# Patient Record
Sex: Male | Born: 1965 | Race: White | Hispanic: No | Marital: Married | Smoking: Current every day smoker
Health system: Southern US, Community
[De-identification: ages and names within clinical notes are randomized; demographics above are authoritative.]

## PROBLEM LIST (undated history)

## (undated) HISTORY — PX: HERNIA REPAIR: SHX51

## (undated) HISTORY — PX: CHOLECYSTECTOMY: SHX55

## (undated) HISTORY — PX: CARDIAC CATHETERIZATION: SHX172

---

## 2014-06-22 ENCOUNTER — Emergency Department (HOSPITAL_COMMUNITY): Payer: BC Managed Care – PPO

## 2014-06-22 ENCOUNTER — Emergency Department (HOSPITAL_COMMUNITY)
Admission: EM | Admit: 2014-06-22 | Discharge: 2014-06-22 | Disposition: A | Discharge status: Home / Self Care | Payer: BC Managed Care – PPO | Attending: Emergency Medicine | Admitting: Emergency Medicine

## 2014-06-22 ENCOUNTER — Encounter (HOSPITAL_COMMUNITY): Payer: Self-pay | Admitting: Emergency Medicine

## 2014-06-22 DIAGNOSIS — K219 Gastro-esophageal reflux disease without esophagitis: Secondary | ICD-10-CM | POA: Insufficient documentation

## 2014-06-22 DIAGNOSIS — Z9889 Other specified postprocedural states: Secondary | ICD-10-CM | POA: Diagnosis not present

## 2014-06-22 DIAGNOSIS — R079 Chest pain, unspecified: Secondary | ICD-10-CM | POA: Insufficient documentation

## 2014-06-22 DIAGNOSIS — Z720 Tobacco use: Secondary | ICD-10-CM | POA: Insufficient documentation

## 2014-06-22 DIAGNOSIS — Z9089 Acquired absence of other organs: Secondary | ICD-10-CM | POA: Insufficient documentation

## 2014-06-22 LAB — CBC
HCT: 41.4 % (ref 39.0–52.0)
Hemoglobin: 14.7 g/dL (ref 13.0–17.0)
MCH: 29.8 pg (ref 26.0–34.0)
MCHC: 35.5 g/dL (ref 30.0–36.0)
MCV: 84 fL (ref 78.0–100.0)
Platelets: 173 10*3/uL (ref 150–400)
RBC: 4.93 MIL/uL (ref 4.22–5.81)
RDW: 12.9 % (ref 11.5–15.5)
WBC: 6.3 10*3/uL (ref 4.0–10.5)

## 2014-06-22 LAB — BASIC METABOLIC PANEL
Anion gap: 15 (ref 5–15)
BUN: 19 mg/dL (ref 6–23)
CO2: 21 meq/L (ref 19–32)
CREATININE: 0.93 mg/dL (ref 0.50–1.35)
Calcium: 10.4 mg/dL (ref 8.4–10.5)
Chloride: 104 mEq/L (ref 96–112)
GFR calc Af Amer: >90 mL/min (ref >90)
GLUCOSE: 124 mg/dL — AB (ref 70–99)
Potassium: 4.6 mEq/L (ref 3.7–5.3)
Sodium: 140 mEq/L (ref 137–147)

## 2014-06-22 LAB — I-STAT TROPONIN, ED
Troponin i, poc: 0.01 ng/mL (ref 0.00–0.08)
Troponin i, poc: 0.02 ng/mL (ref 0.00–0.08)

## 2014-06-22 MED ORDER — GI COCKTAIL ~~LOC~~
30.0000 mL | Freq: Once | ORAL | Status: AC
  Administered 2014-06-22: 30 mL via ORAL
  Filled 2014-06-22: qty 30

## 2014-06-22 MED ORDER — OMEPRAZOLE 20 MG PO CPDR: 20.0000 mg | DELAYED_RELEASE_CAPSULE | Freq: Every day | ORAL | Status: AC

## 2014-06-22 NOTE — ED Provider Notes (Addendum)
CSN: [id number]     Arrival date & time 06/22/14  0310 History   First MD Initiated Contact with Patient 06/22/14 [phone]     Chief Complaint  Patient presents with  . Chest Pain      HPI Patient presents to the emergency department with complaints of some burning anterior chest discomfort without radiation and several episodes of vomiting.  This began approximately one to 2 hours ago.  He states he has an allergy to [place] and reports this is what he thought was the cause.  He began feeling worse and was concerned about his heart and therefore came to the ER for evaluation.  No prior history of cardiac disease.  He did have what sounds like an a [place]  He had a heart catheterization done that time that he described as "squeaky clean per the cardiologist".  He does not have exertional chest pain or shortness breath.  Denies shortness of breath this time.  He states his anterior chest burning and tightness is much better. [place] for [employer].    History reviewed. No pertinent past medical history. Past Surgical History  Procedure Laterality Date  . Cardiac catheterization    . Cholecystectomy    . Hernia repair     History reviewed. No pertinent family history. History  Substance Use Topics  . Smoking status: Current Every Day Smoker -- 0.50 packs/day for 25 years    Types: Cigarettes  . Smokeless tobacco: Never Used  . Alcohol Use: 0.6 oz/week    1 Cans of beer per week    Review of Systems  All other systems reviewed and are negative.     Allergies  Other and Thorazine  Home Medications   Prior to Admission medications   Not on File   BP 123/84  Pulse 99  Temp(Src) 98 F (36.7 C) (Oral)  Resp 16  Ht 5\' 9"  (1.753 m)  Wt 187 lb (84.823 kg)  BMI 27.60 kg/m2  SpO2 96% Physical Exam  Nursing note and vitals reviewed. Constitutional: He is oriented to person, place, and time. He appears  well-developed and well-nourished.  HENT:  Head: Normocephalic and atraumatic.  Eyes: EOM are normal.  Neck: Normal range of motion.  Cardiovascular: Normal rate, regular rhythm, normal heart sounds and intact distal pulses.   Pulmonary/Chest: Effort normal and breath sounds normal. No respiratory distress.  Abdominal: Soft. He exhibits no distension. There is no tenderness.  Musculoskeletal: Normal range of motion.  Neurological: He is alert and oriented to person, place, and time.  Skin: Skin is warm and dry.  Psychiatric: He has a normal mood and affect. Judgment normal.    ED Course  Procedures (including critical care time) Labs Review Labs Reviewed  BASIC METABOLIC PANEL - Abnormal; Notable for the following:    Glucose, Bld 124 (*)    All other components within normal limits  CBC  I-STAT TROPOININ, ED  Rosezena SensorI-STAT TROPOININ, ED    Imaging Review Dg Chest Port 1 View  06/22/2014   CLINICAL DATA:  Chest pain.  Initial encounter  EXAM: PORTABLE CHEST - 1 VIEW  COMPARISON:  None.  FINDINGS: Normal heart size and mediastinal contours. No acute infiltrate or edema. No effusion or pneumothorax. No acute osseous findings.  IMPRESSION: No active disease.   Electronically Signed   By: Tiburcio PeaJonathan  Watts M.D.   On: 06/22/2014 04:16     EKG Interpretation   Date/Time:  Sunday June 22 2014 03:19:42 EDT Ventricular Rate:  91 PR Interval:  146 QRS Duration: 90 QT Interval:  347 QTC Calculation: 427 R Axis:   82 Text Interpretation:  Sinus rhythm No old tracing to compare Confirmed by  Brando Taves  MD, Caryn BeeKEVIN ([id number]) on 06/22/2014 6:03:58 AM      MDM   Final diagnoses:  None    6:02 AM Patient feels much better in the emergency department.  Resolution of his symptoms with a GI cocktail.  Some of this may represented GERD/esophageal spasm.  Home on a PPI.  PCP followup.  It clean coronary arteries in 2007.  My suspicion for cardiac disease is low.  Doubt dissection.  No abdominal  discomfort or pain.  Overall well-appearing.  Discharge home in good condition.  Please note that the initial troponin was drawn at 3:20 AM with the rest of his initial labs.  It is documented as 422 but this is not the case as it was drawn at 320.  Lyanne CoKevin M Shamar Engelmann, MD 06/22/14 [id number]  Lyanne CoKevin M Burnie Therien, MD 06/22/14 [phone]

## 2014-06-22 NOTE — Discharge Instructions (Signed)
Chest Pain (Nonspecific) °It is often hard to give a specific diagnosis for the cause of chest pain. There is always a chance that your pain could be related to something serious, such as a heart attack or a blood clot in the lungs. You need to follow up with your health care provider for further evaluation. °CAUSES  °· Heartburn. °· Pneumonia or bronchitis. °· Anxiety or stress. °· Inflammation around your heart (pericarditis) or lung (pleuritis or pleurisy). °· A blood clot in the lung. °· A collapsed lung (pneumothorax). It can develop suddenly on its own (spontaneous pneumothorax) or from trauma to the chest. °· Shingles infection (herpes zoster virus). °The chest wall is composed of bones, muscles, and cartilage. Any of these can be the source of the pain. °· The bones can be bruised by injury. °· The muscles or cartilage can be strained by coughing or overwork. °· The cartilage can be affected by inflammation and become sore (costochondritis). °DIAGNOSIS  °Lab tests or other studies may be needed to find the cause of your pain. Your health care provider may have you take a test called an ambulatory electrocardiogram (ECG). An ECG records your heartbeat patterns over a 24-hour period. You may also have other tests, such as: °· Transthoracic echocardiogram (TTE). During echocardiography, sound waves are used to evaluate how blood flows through your heart. °· Transesophageal echocardiogram (TEE). °· Cardiac monitoring. This allows your health care provider to monitor your heart rate and rhythm in real time. °· Holter monitor. This is a portable device that records your heartbeat and can help diagnose heart arrhythmias. It allows your health care provider to track your heart activity for several days, if needed. °· Stress tests by exercise or by giving medicine that makes the heart beat faster. °TREATMENT  °· Treatment depends on what may be causing your chest pain. Treatment may include: °· Acid blockers for  heartburn. °· Anti-inflammatory medicine. °· Pain medicine for inflammatory conditions. °· Antibiotics if an infection is present. °· You may be advised to change lifestyle habits. This includes stopping smoking and avoiding alcohol, caffeine, and chocolate. °· You may be advised to keep your head raised (elevated) when sleeping. This reduces the chance of acid going backward from your stomach into your esophagus. °Most of the time, nonspecific chest pain will improve within 2-3 days with rest and mild pain medicine.  °HOME CARE INSTRUCTIONS  °· If antibiotics were prescribed, take them as directed. Finish them even if you start to feel better. °· For the next few days, avoid physical activities that bring on chest pain. Continue physical activities as directed. °· Do not use any tobacco products, including cigarettes, chewing tobacco, or electronic cigarettes. °· Avoid drinking alcohol. °· Only take medicine as directed by your health care provider. °· Follow your health care provider's suggestions for further testing if your chest pain does not go away. °· Keep any follow-up appointments you made. If you do not go to an appointment, you could develop lasting (chronic) problems with pain. If there is any problem keeping an appointment, call to reschedule. °SEEK MEDICAL CARE IF:  °· Your chest pain does not go away, even after treatment. °· You have a rash with blisters on your chest. °· You have a fever. °SEEK IMMEDIATE MEDICAL CARE IF:  °· You have increased chest pain or pain that spreads to your arm, neck, jaw, back, or abdomen. °· You have shortness of breath. °· You have an increasing cough, or you cough   up blood. °· You have severe back or abdominal pain. °· You feel nauseous or vomit. °· You have severe weakness. °· You faint. °· You have chills. °This is an emergency. Do not wait to see if the pain will go away. Get medical help at once. Call your local emergency services (911 in [place] Do not drive  yourself to the hospital. °MAKE SURE YOU:  °· Understand these instructions. °· Will watch your condition. °· Will get help right away if you are not doing well or get worse. °Document Released: 06/01/2005 Document Revised: 08/27/2013 Document Reviewed: 03/27/2008 °ExitCare® Patient Information ©2015 [employer]. This information is not intended to replace advice given to you by your health care provider. Make sure you discuss any questions you have with your health care provider. °Gastroesophageal Reflux Disease, Adult °Gastroesophageal reflux disease (GERD) happens when acid from your stomach flows up into the esophagus. When acid comes in contact with the esophagus, the acid causes soreness (inflammation) in the esophagus. Over time, GERD may create small holes (ulcers) in the lining of the esophagus. °CAUSES  °· Increased body weight. This puts pressure on the stomach, making acid rise from the stomach into the esophagus. °· Smoking. This increases acid production in the stomach. °· Drinking alcohol. This causes decreased pressure in the lower esophageal sphincter (valve or ring of muscle between the esophagus and stomach), allowing acid from the stomach into the esophagus. °· Late evening meals and a full stomach. This increases pressure and acid production in the stomach. °· A malformed lower esophageal sphincter. °Sometimes, no cause is found. °SYMPTOMS  °· Burning pain in the lower part of the mid-chest behind the breastbone and in the mid-stomach area. This may occur twice a week or more often. °· Trouble swallowing. °· Sore throat. °· Dry cough. °· Asthma-like symptoms including chest tightness, shortness of breath, or wheezing. °DIAGNOSIS  °Your caregiver may be able to diagnose GERD based on your symptoms. In some cases, X-rays and other tests may be done to check for complications or to check the condition of your stomach and esophagus. °TREATMENT  °Your caregiver may recommend over-the-counter or  prescription medicines to help decrease acid production. Ask your caregiver before starting or adding any new medicines.  °HOME CARE INSTRUCTIONS  °· Change the factors that you can control. Ask your caregiver for guidance concerning weight loss, quitting smoking, and alcohol consumption. °· Avoid foods and drinks that make your symptoms worse, such as: °¨ Caffeine or alcoholic drinks. °¨ Chocolate. °¨ Peppermint or mint flavorings. °¨ Garlic and onions. °¨ Spicy foods. °¨ Citrus fruits, such as oranges, lemons, or limes. °¨ Tomato-based foods such as sauce, chili, salsa, and pizza. °¨ Fried and fatty foods. °· Avoid lying down for the 3 hours prior to your bedtime or prior to taking a nap. °· Eat small, frequent meals instead of large meals. °· Wear loose-fitting clothing. Do not wear anything tight around your waist that causes pressure on your stomach. °· Raise the head of your bed 6 to 8 inches with wood blocks to help you sleep. Extra pillows will not help. °· Only take over-the-counter or prescription medicines for pain, discomfort, or fever as directed by your caregiver. °· Do not take aspirin, ibuprofen, or other nonsteroidal anti-inflammatory drugs (NSAIDs). °SEEK IMMEDIATE MEDICAL CARE IF:  °· You have pain in your arms, neck, jaw, teeth, or back. °· Your pain increases or changes in intensity or duration. °· You develop nausea, vomiting, or sweating (diaphoresis). °·   You develop shortness of breath, or you faint. °· Your vomit is green, yellow, black, or looks like coffee grounds or blood. °· Your stool is red, bloody, or black. °These symptoms could be signs of other problems, such as heart disease, gastric bleeding, or esophageal bleeding. °MAKE SURE YOU:  °· Understand these instructions. °· Will watch your condition. °· Will get help right away if you are not doing well or get worse. °Document Released: 06/01/2005 Document Revised: 11/14/2011 Document Reviewed: 03/11/2011 °ExitCare® Patient  Information ©2015 [employer]. This information is not intended to replace advice given to you by your health care provider. Make sure you discuss any questions you have with your health care provider. ° °

## 2014-06-22 NOTE — ED Notes (Signed)
Pt. Has an allergy to [place]. Thinks there may have been some in his meal. Pt. Says his allergic reaction is similar to his current symptoms except for the chest pain. Pt. Has had 1 episode of vomiting. Denies dizziness and SOB.

## 2014-06-22 NOTE — ED Notes (Signed)
Pt. Refused wheelchair 

## 2019-06-21 ENCOUNTER — Other Ambulatory Visit: Payer: Self-pay

## 2019-06-21 ENCOUNTER — Emergency Department (HOSPITAL_COMMUNITY): Payer: BC Managed Care – PPO

## 2019-06-21 ENCOUNTER — Emergency Department (HOSPITAL_COMMUNITY)
Admission: EM | Admit: 2019-06-21 | Discharge: 2019-06-21 | Disposition: A | Discharge status: Home / Self Care | Payer: BC Managed Care – PPO | Attending: Emergency Medicine | Admitting: Emergency Medicine

## 2019-06-21 ENCOUNTER — Encounter (HOSPITAL_COMMUNITY): Payer: Self-pay | Admitting: Emergency Medicine

## 2019-06-21 DIAGNOSIS — R0789 Other chest pain: Secondary | ICD-10-CM | POA: Diagnosis not present

## 2019-06-21 DIAGNOSIS — Z79899 Other long term (current) drug therapy: Secondary | ICD-10-CM | POA: Insufficient documentation

## 2019-06-21 DIAGNOSIS — F17210 Nicotine dependence, cigarettes, uncomplicated: Secondary | ICD-10-CM | POA: Insufficient documentation

## 2019-06-21 DIAGNOSIS — R079 Chest pain, unspecified: Secondary | ICD-10-CM | POA: Diagnosis present

## 2019-06-21 LAB — BASIC METABOLIC PANEL
Anion gap: 13 (ref 5–15)
BUN: 16 mg/dL (ref 6–20)
CO2: 22 mmol/L (ref 22–32)
Calcium: 10.4 mg/dL — ABNORMAL HIGH (ref 8.9–10.3)
Chloride: 104 mmol/L (ref 98–111)
Creatinine, Ser: 0.93 mg/dL (ref 0.61–1.24)
GFR calc Af Amer: >60 mL/min (ref >60)
GFR calc non Af Amer: >60 mL/min (ref >60)
Glucose, Bld: 96 mg/dL (ref 70–99)
Potassium: 3.8 mmol/L (ref 3.5–5.1)
Sodium: 139 mmol/L (ref 135–145)

## 2019-06-21 LAB — CBC
HCT: 45.4 % (ref 39.0–52.0)
Hemoglobin: 15.3 g/dL (ref 13.0–17.0)
MCH: 29.4 pg (ref 26.0–34.0)
MCHC: 33.7 g/dL (ref 30.0–36.0)
MCV: 87.1 fL (ref 80.0–100.0)
Platelets: 214 10*3/uL (ref 150–400)
RBC: 5.21 MIL/uL (ref 4.22–5.81)
RDW: 13.7 % (ref 11.5–15.5)
WBC: 7.7 10*3/uL (ref 4.0–10.5)
nRBC: 0 % (ref 0.0–0.2)

## 2019-06-21 LAB — TROPONIN I (HIGH SENSITIVITY)
Troponin I (High Sensitivity): 4 ng/L (ref <18)
Troponin I (High Sensitivity): 4 ng/L (ref <18)

## 2019-06-21 MED ORDER — SODIUM CHLORIDE 0.9% FLUSH: 3.0000 mL | Freq: Once | INTRAVENOUS | Status: DC

## 2019-06-21 NOTE — Discharge Instructions (Signed)
Please follow up with your cardiology Return to the nearest Emergency Department if you start to develop severe, constant chest pain, vomiting, dizziness, or shortness of breath

## 2019-06-21 NOTE — ED Provider Notes (Signed)
[facility] EMERGENCY DEPARTMENT Provider Note   CSN: [id number] Arrival date & time: 06/21/19  1103     History   Chief Complaint Chief Complaint  Patient presents with  . Chest Pain    HPI Ronald Kelly is a 53 y.o. male who presents with chest pain. PMH significant for moderate aortic stenosis, chronic back pain, GERD.  Patient states that he was in a meeting this morning at the [employer] when he started to have chest pain at approximately 10 AM while seated.  He states that it felt like a "poking" in his chest over the left side.  The pain was intermittent and did not radiate.  He had some mild diaphoresis and shortness of breath with it and then had to walk around and drink some water.  The pain went away but then returned and so he told his son that he needed to come to the emergency department.  He states that he was here approximately 5 years ago for chest pain as well.  At that time symptoms were attributed to acid reflux because he had a [place] which she is allergic to.  He does have a cardiologist in [place].  He has had a catheterization and echocardiogram.  He states that sometimes he has issues with his heart valve and her heart will race but he has had not had those symptoms today. He was admitted to [facility] general health for syncope in 2017. Was found to have episode of A.fib which resolved spontaneously. Was ruled out for MI. Echo in 2018 showed EF 60-65% with moderate aortic stenosis. Aortic valve is bicuspid. Has previously had a cath which did not show any obstructive disease. He reports drinking 2 beers 2 days ago. No drug use. No fever, cough, syncope, palpitations, abdominal pain, vomiting, leg swelling. He is currently asymptomatic.  HPI  History reviewed. No pertinent past medical history.  There are no active problems to display for this patient.   Past Surgical History:  Procedure Laterality Date  . CARDIAC  CATHETERIZATION    . CHOLECYSTECTOMY    . HERNIA REPAIR          Home Medications    Prior to Admission medications   Medication Sig Start Date End Date Taking? Authorizing Provider  gemfibrozil (LOPID) 600 MG tablet Take 600 mg by mouth 2 (two) times daily before a meal.    [provider]  metoprolol succinate (TOPROL-XL) 25 MG 24 hr tablet Take 25 mg by mouth daily.    [provider]  omeprazole (PRILOSEC) 20 MG capsule Take 1 capsule (20 mg total) by mouth daily. 06/22/14   Jola Schmidt, MD    Family History No family history on file.  Social History Social History   Tobacco Use  . Smoking status: Current Every Day Smoker    Packs/day: 0.50    Years: 25.00    Pack years: 12.50    Types: Cigarettes  . Smokeless tobacco: Never Used  Substance Use Topics  . Alcohol use: Yes    Alcohol/week: 1.0 standard drinks    Types: 1 Cans of beer per week  . Drug use: No     Allergies   Other and Thorazine [chlorpromazine]   Review of Systems Review of Systems  Constitutional: Negative for fever.  Respiratory: Negative for cough and shortness of breath.   Cardiovascular: Positive for chest pain. Negative for palpitations and leg swelling.  Gastrointestinal: Negative for abdominal pain, nausea and  vomiting.  Neurological: Negative for light-headedness.  All other systems reviewed and are negative.    Physical Exam Updated Vital Signs BP 131/88 (BP Location: Left Arm)   Pulse 97   Temp 98.4 F (36.9 C) (Oral)   Resp 18   SpO2 96%   Physical Exam Vitals signs and nursing note reviewed.  Constitutional:      General: He is not in acute distress.    Appearance: He is well-developed. He is not ill-appearing.  HENT:     Head: Normocephalic and atraumatic.  Eyes:     General: No scleral icterus.       Right eye: No discharge.        Left eye: No discharge.     Conjunctiva/sclera: Conjunctivae normal.     Pupils: Pupils are equal, round, and  reactive to light.  Neck:     Musculoskeletal: Normal range of motion.  Cardiovascular:     Rate and Rhythm: Normal rate and regular rhythm.  Pulmonary:     Effort: Pulmonary effort is normal. No respiratory distress.     Breath sounds: Normal breath sounds.  Abdominal:     General: There is no distension.     Palpations: Abdomen is soft.     Tenderness: There is no abdominal tenderness.  Skin:    General: Skin is warm and dry.  Neurological:     Mental Status: He is alert and oriented to person, place, and time.  Psychiatric:        Behavior: Behavior normal.      ED Treatments / Results  Labs (all labs ordered are listed, but only abnormal results are displayed) Labs Reviewed  BASIC METABOLIC PANEL - Abnormal; Notable for the following components:      Result Value   Calcium 10.4 (*)    All other components within normal limits  CBC  TROPONIN I (HIGH SENSITIVITY)  TROPONIN I (HIGH SENSITIVITY)    EKG EKG Interpretation  Date/Time:  Friday June 21 2019 14:02:34 EDT Ventricular Rate:  70 PR Interval:    QRS Duration: 93 QT Interval:  382 QTC Calculation: 413 R Axis:   77 Text Interpretation:  Normal sinus rhythm No significant change since last tracing Confirmed by Margarita Grizzle [phone]) on 06/21/2019 2:07:42 PM Also confirmed by Margarita Grizzle [phone]), editor Elita Quick ([id number])  on 06/21/2019 2:15:16 PM   Radiology Dg Chest 2 View  Result Date: 06/21/2019 CLINICAL DATA:  Chest pain EXAM: CHEST - 2 VIEW COMPARISON:  Chest radiograph dated 06/22/2014. FINDINGS: The heart size and mediastinal contours are within normal limits. Both lungs are clear. The visualized skeletal structures are unremarkable. IMPRESSION: No active cardiopulmonary disease. Electronically Signed   By: Romona Curls M.D.   On: 06/21/2019 11:58    Procedures Procedures (including critical care time)  Medications Ordered in ED Medications  sodium chloride flush (NS) 0.9 %  injection 3 mL (has no administration in time range)     Initial Impression / Assessment and Plan / ED Course  I have reviewed the triage vital signs and the nursing notes.  Pertinent labs & imaging results that were available during my care of the patient were reviewed by me and considered in my medical decision making (see chart for details).  Chest pain work up is reassuring. Doubt ACS, PE, pericarditis, esophageal rupture, tension pneumothorax, aortic dissection, cardiac tamponade. EKG is NSR and shows no significant change since last. CXR is negative. Initial and second troponin is 4 and 4  respectively. Labs are unremarkable. Patient is non-smoker. HEART score is 2. Previous cardiac work up by Dr. Laurey MoraleGohn with [facility] [place]. Advised f/u with Cardiology.   Final Clinical Impressions(s) / ED Diagnoses   Final diagnoses:  Atypical chest pain    ED Discharge Orders    None       Bethel BornGekas, Mikalyn Hermida Marie, PA-C 06/21/19 1508    Margarita Grizzleay, Danielle, MD 06/24/19 [phone]

## 2019-06-21 NOTE — ED Notes (Signed)
Patient verbalizes understanding of discharge instructions. Opportunity for questioning and answers were provided. Pt discharged from ED. 

## 2019-06-21 NOTE — ED Triage Notes (Signed)
Pt states had some cp and sob this am is here from [place] with [employer] , has a troubled bicuspid valve he states  And reflux

## 2021-06-22 IMAGING — DX DG CHEST 2V
2 series · 2 of 2 positions shown · non-contrast
Comparison: Chest radiograph dated 06/22/2014.

CLINICAL DATA: Chest pain

EXAM:
CHEST - 2 VIEW

[chest pa]
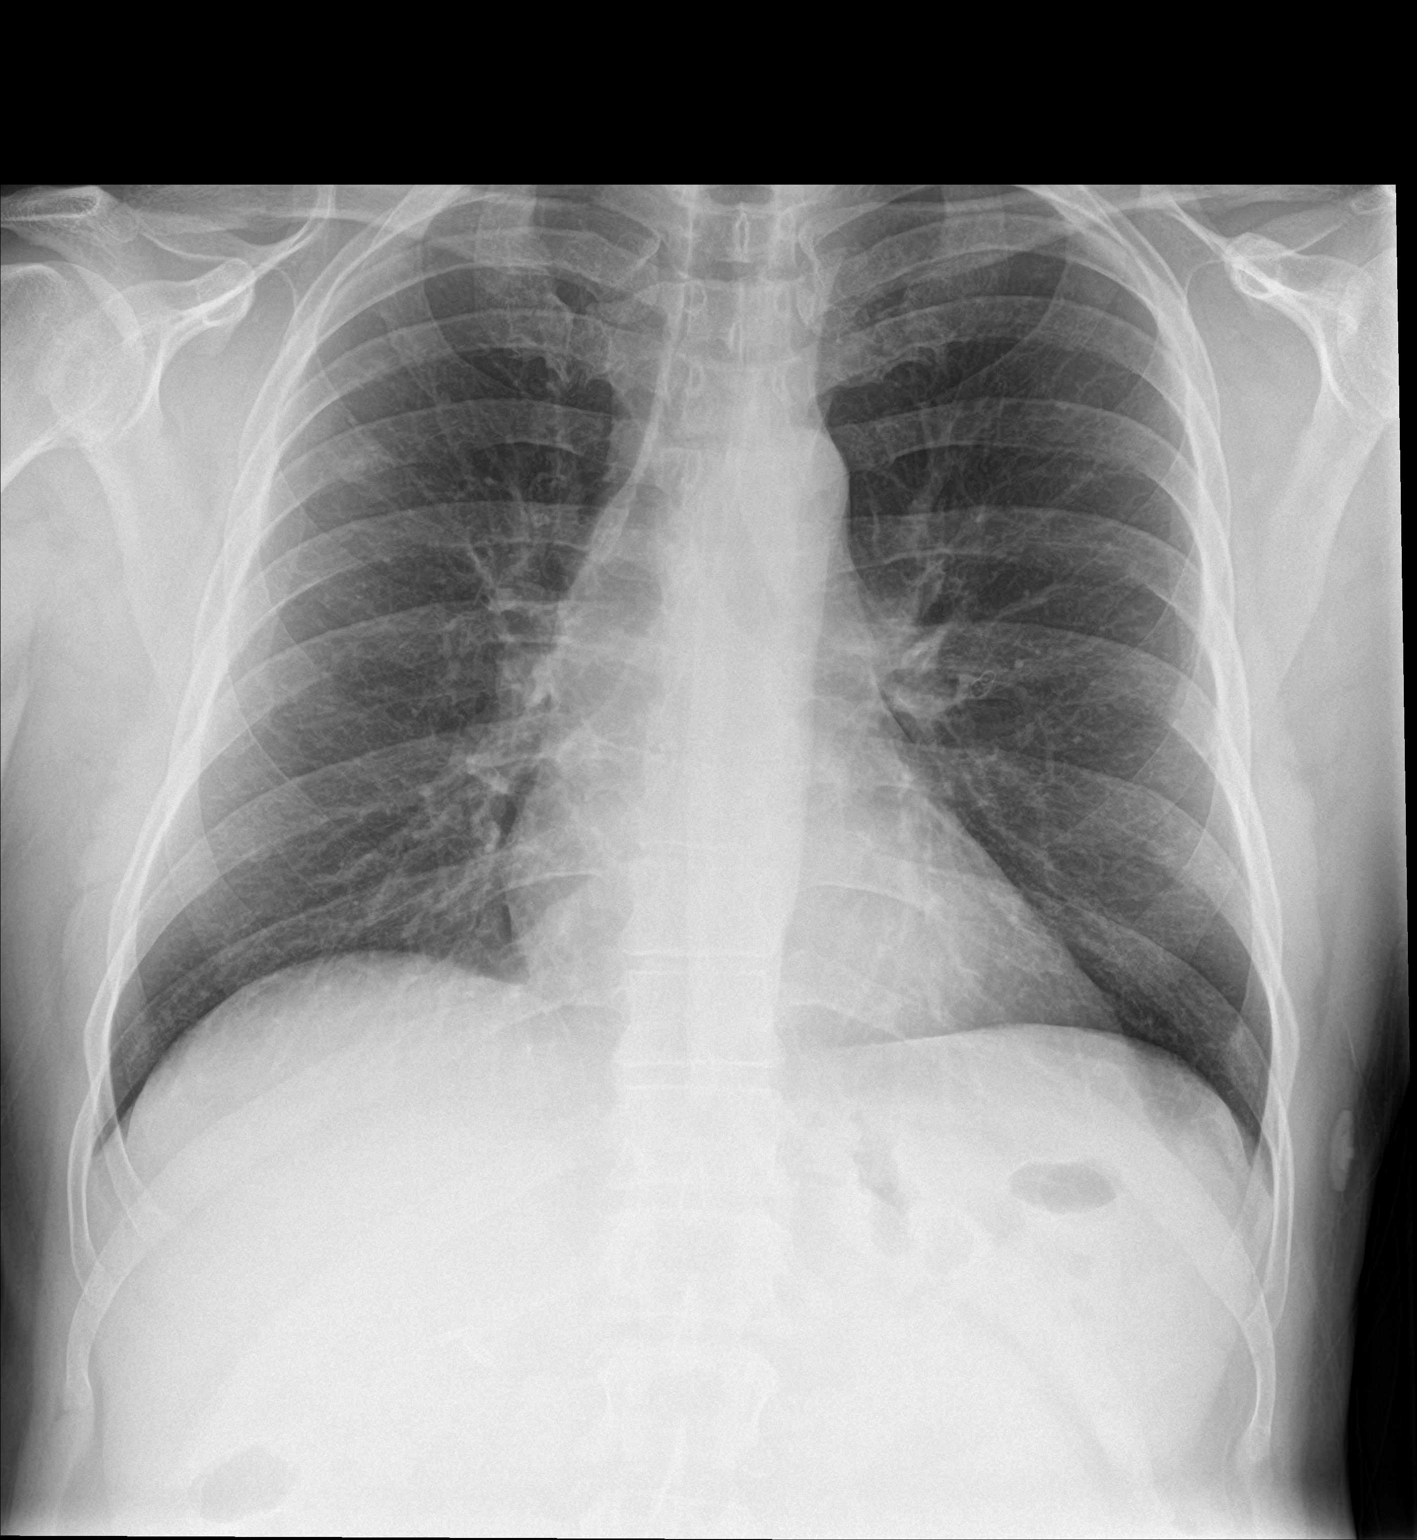

[chest lat]
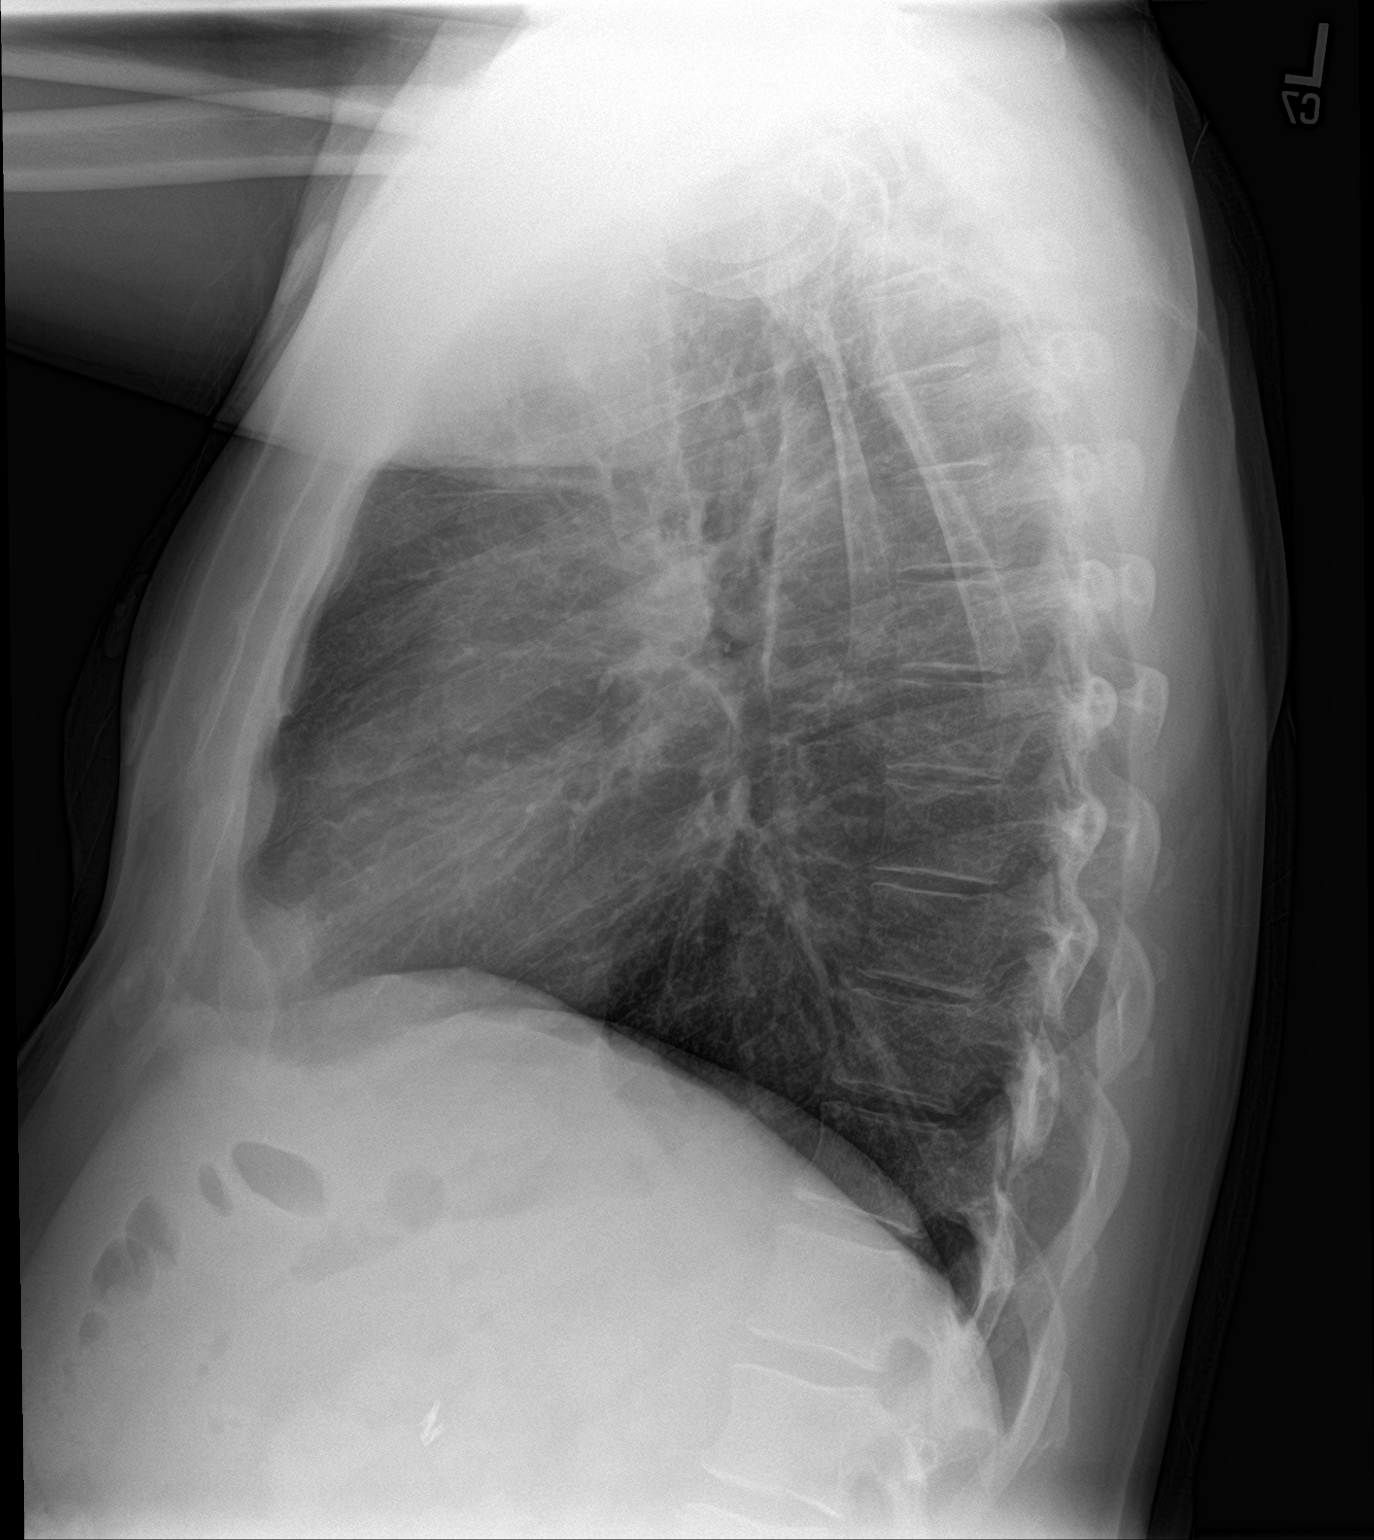

[2 of 2 positions shown; findings below may reference images not displayed]

FINDINGS: The heart size and mediastinal contours are within normal limits.
Both lungs are clear. The visualized skeletal structures are
unremarkable.
IMPRESSION: No active cardiopulmonary disease.
